# Patient Record
Sex: Female | Born: 1955 | Race: White | Hispanic: No | Smoking: Never smoker
Health system: Southern US, Community
[De-identification: ages and names within clinical notes are randomized; demographics above are authoritative.]

---

## 2011-09-27 ENCOUNTER — Ambulatory Visit (INDEPENDENT_AMBULATORY_CARE_PROVIDER_SITE_OTHER): Payer: 59 | Admitting: Internal Medicine

## 2011-09-27 ENCOUNTER — Ambulatory Visit: Payer: 59

## 2011-09-27 VITALS — BP 107/74 | HR 71 | Temp 97.0°F | Resp 18 | Ht 64.5 in | Wt 136.0 lb

## 2011-09-27 DIAGNOSIS — R1032 Left lower quadrant pain: Secondary | ICD-10-CM

## 2011-09-27 LAB — POCT UA - MICROSCOPIC ONLY
Casts, Ur, LPF, POC: NEGATIVE
Crystals, Ur, HPF, POC: NEGATIVE

## 2011-09-27 LAB — COMPREHENSIVE METABOLIC PANEL
ALT: 25 U/L (ref 0–35)
AST: 25 U/L (ref 0–37)
Albumin: 4.7 g/dL (ref 3.5–5.2)
Alkaline Phosphatase: 67 U/L (ref 39–117)
Potassium: 4.2 mEq/L (ref 3.5–5.3)
Sodium: 138 mEq/L (ref 135–145)
Total Bilirubin: 0.6 mg/dL (ref 0.3–1.2)
Total Protein: 7.5 g/dL (ref 6.0–8.3)

## 2011-09-27 LAB — POCT CBC
Granulocyte percent: 66.2 %G (ref 37–80)
HCT, POC: 46.8 % (ref 37.7–47.9)
Lymph, poc: 2.4 (ref 0.6–3.4)
MCHC: 32.1 g/dL (ref 31.8–35.4)
MPV: 10.2 fL (ref 0–99.8)
POC Granulocyte: 6.1 (ref 2–6.9)
POC LYMPH PERCENT: 25.7 %L (ref 10–50)
POC MID %: 8.1 %M (ref 0–12)
Platelet Count, POC: 266 10*3/uL (ref 142–424)
RDW, POC: 13.3 %

## 2011-09-27 LAB — POCT URINALYSIS DIPSTICK
Ketones, UA: NEGATIVE
Protein, UA: NEGATIVE
Urobilinogen, UA: 0.2
pH, UA: 6.5

## 2011-09-27 LAB — POCT WET PREP WITH KOH
KOH Prep POC: NEGATIVE
Trichomonas, UA: NEGATIVE
Yeast Wet Prep HPF POC: NEGATIVE

## 2011-09-27 MED ORDER — KETOROLAC TROMETHAMINE 60 MG/2ML IM SOLN
60.0000 mg | Freq: Once | INTRAMUSCULAR | Status: AC
  Administered 2011-09-27: 60 mg via INTRAMUSCULAR

## 2011-09-27 MED ORDER — NAPROXEN 500 MG PO TABS: ORAL_TABLET | ORAL | Status: AC

## 2011-09-27 MED ORDER — METAXALONE 800 MG PO TABS: ORAL_TABLET | ORAL | Status: AC

## 2011-09-27 NOTE — Progress Notes (Signed)
Subjective:    Patient ID: Wanda Ford, female    DOB: 04/12/1956, 56 y.o.   MRN: [id number]   HPI 56 yo CF c/o LLQ pain that started suddenly yesterday. No f/c.  No N/V.  No change in BMs.  No new exercises or activities. Her appetite is normal. No blood in stool.  Denies STD risk factors or vaginal discharge. Pain worse when moving around, improves when being still.  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 No other abdominal surgeries.  She is UTD on MMG, her pap was 6 months ago, she is UTD on her MMG.  Review of Systems  All other systems reviewed and are negative.       Objective:   Physical Exam  Nursing note and vitals reviewed. Constitutional: She is oriented to person, place, and time. She appears well-developed and well-nourished.  HENT:  Head: Normocephalic and atraumatic.  Mouth/Throat: Oropharynx is clear and moist.  Neck: Normal range of motion. Neck supple. No thyromegaly present.  Cardiovascular: Normal rate, regular rhythm and normal heart sounds.   Pulmonary/Chest: Effort normal and breath sounds normal.  Abdominal: Soft. Bowel sounds are normal. She exhibits no distension and no mass. There is no tenderness. There is no rebound and no guarding.       Unable to reproduce the pain.  She points to the area over her inguinal region.  Genitourinary: Vagina normal and uterus normal. No vaginal discharge found.       Wet prep and gen probe. Adnexa non-palpable and non tender.  Bimanual unremarkable.  Neurological: She is alert and oriented to person, place, and time.  Skin: Skin is warm and dry.   Results for orders placed in visit on 09/27/11  POCT CBC      Component Value Range   WBC 9.2  4.6 - 10.2 K/uL   Lymph, poc 2.4  0.6 - 3.4   POC LYMPH PERCENT 25.7  10 - 50 %L   MID (cbc) 0.7  0 - 0.9   POC MID % 8.1  0 - 12 %M   POC Granulocyte 6.1  2 - 6.9   Granulocyte percent 66.2  37 - 80 %G   RBC 5.68 (*) 4.04 - 5.48 M/uL   Hemoglobin 15.0  12.2 - 16.2 g/dL   HCT, POC 40.9  81.1 - 47.9 %   MCV 82.4  80 - 97 fL   MCH, POC 26.4 (*) 27 - 31.2 pg   MCHC 32.1  31.8 - 35.4 g/dL   RDW, POC 91.4     Platelet Count, POC 266  142 - 424 K/uL   MPV 10.2  0 - 99.8 fL  POCT UA - MICROSCOPIC ONLY      Component Value Range   WBC, Ur, HPF, POC 0-1     RBC, urine, microscopic 0-1     Bacteria, U Microscopic trace     Mucus, UA neg     Epithelial cells, urine per micros 0-2     Crystals, Ur, HPF, POC neg     Casts, Ur, LPF, POC neg     Yeast, UA neg    POCT URINALYSIS DIPSTICK      Component Value Range   Color, UA yellow     Clarity, UA clear     Glucose, UA neg     Bilirubin, UA neg     Ketones, UA neg     Spec Grav, UA  1.010     Blood, UA neg     pH, UA 6.5     Protein, UA neg     Urobilinogen, UA 0.2     Nitrite, UA neg     Leukocytes, UA Trace    POCT WET PREP WITH KOH      Component Value Range   Trichomonas, UA Negative     Clue Cells Wet Prep HPF POC neg     Epithelial Wet Prep HPF POC 1-2     Yeast Wet Prep HPF POC neg     Bacteria Wet Prep HPF POC trace     RBC Wet Prep HPF POC 0-1     WBC Wet Prep HPF POC 0-1     KOH Prep POC Negative     [facility] reading (PRIMARY) by  Dr. Perrin Maltese as +FOS, otherwise NAD.     Assessment & Plan:  LLQ pain-non acute abdomen.  Pain seems to be originating more at hip flexor than abdomen.  If pain persists, consider CT abd/pelvis with and w/o contrast.  For now, will treat as musculoskeletal and constipation.  Start miralax and increase water intake. Toradol IM now and rest.  RTC immediately if f/c, pain worsens, N/V or to ER if severe.

## 2011-09-28 LAB — GC/CHLAMYDIA PROBE AMP, GENITAL: Chlamydia, DNA Probe: NEGATIVE

## 2013-05-03 IMAGING — CR DG ABDOMEN 1V
1 series · 1 of 1 positions shown · non-contrast
Comparison: None

CLINICAL DATA: Left lower quadrant pain

ABDOMEN - 1 VIEW

[AP]
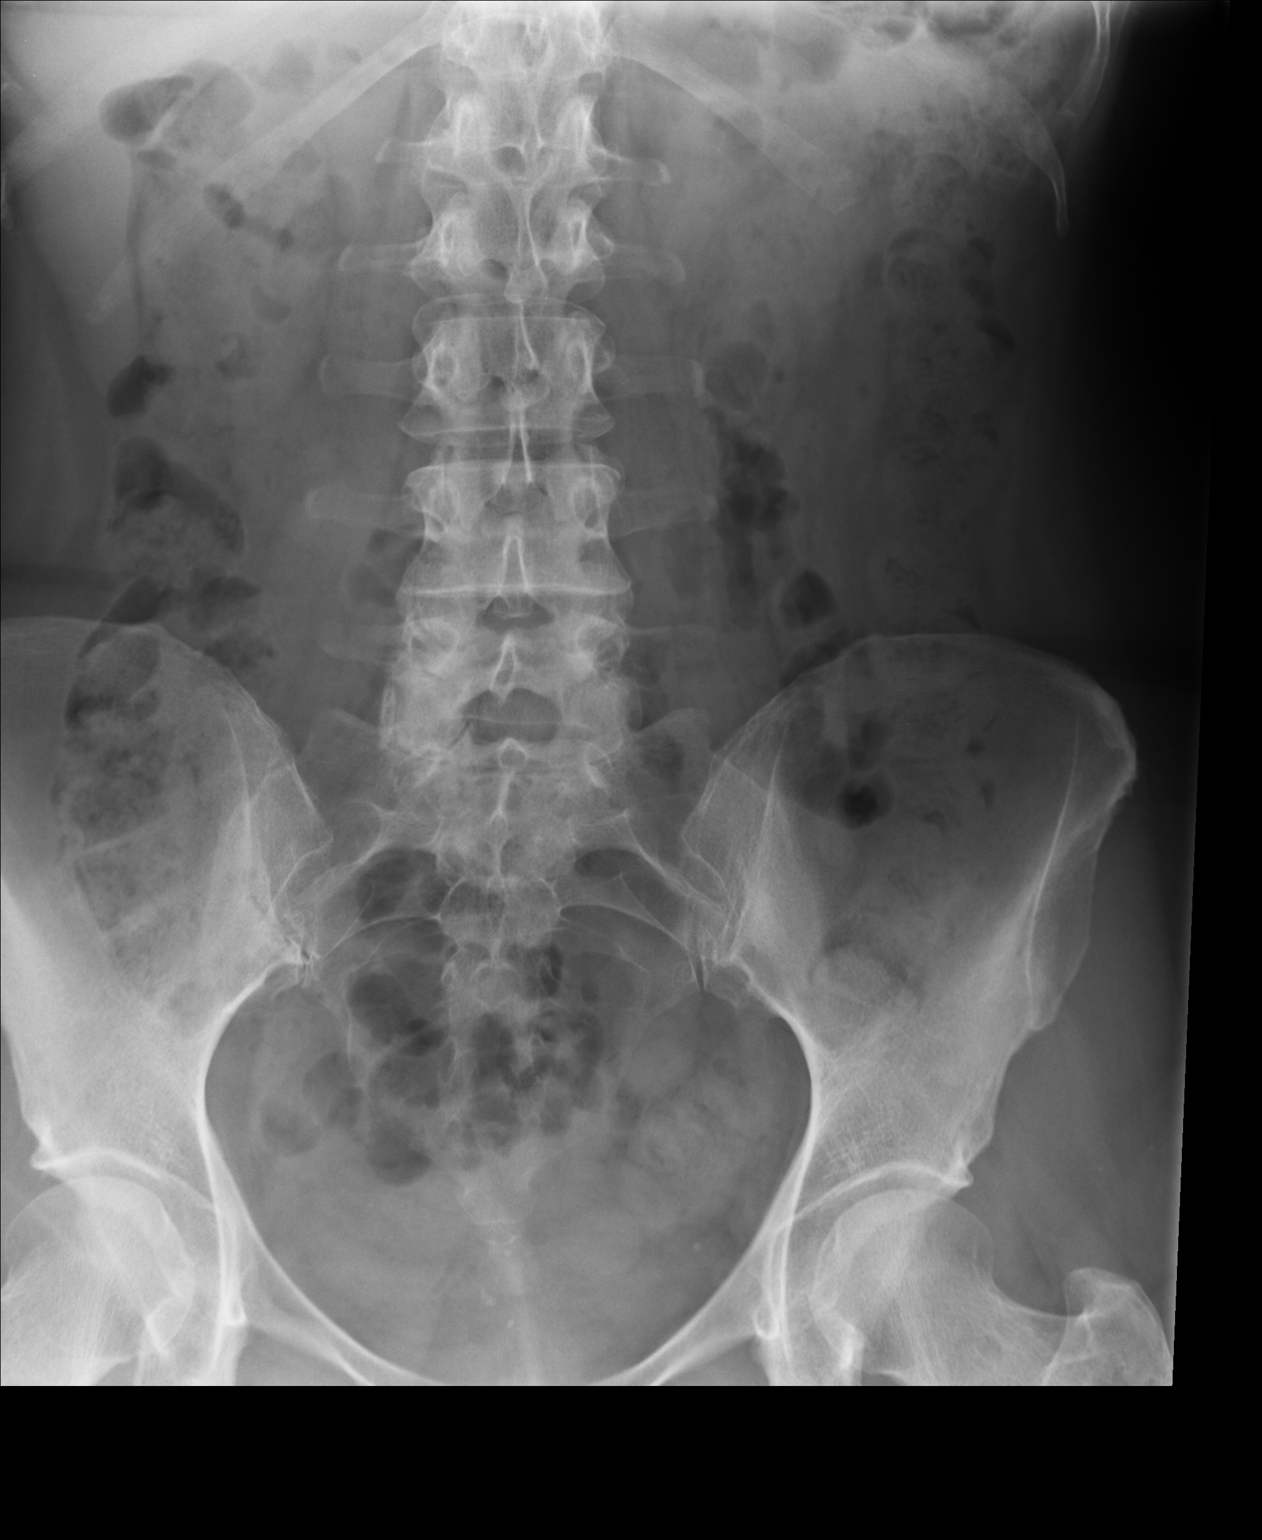

[1 of 1 positions shown; findings below may reference images not displayed]

FINDINGS: Normal bowel gas pattern with scattered stool throughout colon.
No bowel dilatation or bowel wall thickening.
Bones appear demineralized.
No urinary tract calcifications.
Tiny pelvic phleboliths.
IMPRESSION: No acute abnormalities.

Clinically significant discrepancy from primary report, if
provided: None
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
## 2020-10-24 ENCOUNTER — Ambulatory Visit: Payer: Self-pay | Admitting: Orthopaedic Surgery

## 2020-10-31 ENCOUNTER — Ambulatory Visit: Payer: Self-pay

## 2020-10-31 ENCOUNTER — Other Ambulatory Visit: Payer: Self-pay

## 2020-10-31 ENCOUNTER — Encounter: Payer: Self-pay | Admitting: Orthopaedic Surgery

## 2020-10-31 ENCOUNTER — Ambulatory Visit (INDEPENDENT_AMBULATORY_CARE_PROVIDER_SITE_OTHER): Payer: 59 | Admitting: Orthopaedic Surgery

## 2020-10-31 VITALS — Ht 64.0 in | Wt 150.2 lb

## 2020-10-31 DIAGNOSIS — M545 Low back pain, unspecified: Secondary | ICD-10-CM | POA: Diagnosis not present

## 2020-10-31 DIAGNOSIS — G8929 Other chronic pain: Secondary | ICD-10-CM | POA: Diagnosis not present

## 2020-10-31 DIAGNOSIS — M25552 Pain in left hip: Secondary | ICD-10-CM | POA: Diagnosis not present

## 2020-10-31 DIAGNOSIS — M25551 Pain in right hip: Secondary | ICD-10-CM

## 2020-10-31 MED ORDER — DIAZEPAM 5 MG PO TABS: 5.0000 mg | ORAL_TABLET | Freq: Once | ORAL | 0 refills | Status: AC | PRN

## 2020-10-31 NOTE — Addendum Note (Signed)
Addended by: Rogers Seeds on: 10/31/2020 05:17 PM   Modules accepted: Orders
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
## 2020-11-10 ENCOUNTER — Other Ambulatory Visit: Payer: Self-pay

## 2020-11-10 DIAGNOSIS — G8929 Other chronic pain: Secondary | ICD-10-CM

## 2020-11-10 DIAGNOSIS — M25551 Pain in right hip: Secondary | ICD-10-CM

## 2020-11-10 DIAGNOSIS — M545 Low back pain, unspecified: Secondary | ICD-10-CM

## 2020-11-10 DIAGNOSIS — M25552 Pain in left hip: Secondary | ICD-10-CM
